# Patient Record
Sex: Female | Born: 2000 | Race: Black or African American | Hispanic: No | Marital: Single
Health system: Southern US, Community
[De-identification: ages and names within clinical notes are randomized; demographics above are authoritative.]

---
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
## 2021-01-07 DIAGNOSIS — M25311 Other instability, right shoulder: Secondary | ICD-10-CM | POA: Diagnosis not present

## 2021-01-07 DIAGNOSIS — M25511 Pain in right shoulder: Secondary | ICD-10-CM | POA: Diagnosis not present

## 2021-01-07 DIAGNOSIS — M6281 Muscle weakness (generalized): Secondary | ICD-10-CM | POA: Diagnosis not present

## 2021-01-12 DIAGNOSIS — M6281 Muscle weakness (generalized): Secondary | ICD-10-CM | POA: Diagnosis not present

## 2021-01-12 DIAGNOSIS — M25311 Other instability, right shoulder: Secondary | ICD-10-CM | POA: Diagnosis not present

## 2021-01-12 DIAGNOSIS — M25511 Pain in right shoulder: Secondary | ICD-10-CM | POA: Diagnosis not present

## 2021-01-14 DIAGNOSIS — M6281 Muscle weakness (generalized): Secondary | ICD-10-CM | POA: Diagnosis not present

## 2021-01-14 DIAGNOSIS — M25311 Other instability, right shoulder: Secondary | ICD-10-CM | POA: Diagnosis not present

## 2021-01-14 DIAGNOSIS — M25511 Pain in right shoulder: Secondary | ICD-10-CM | POA: Diagnosis not present

## 2021-01-20 DIAGNOSIS — M6281 Muscle weakness (generalized): Secondary | ICD-10-CM | POA: Diagnosis not present

## 2021-01-20 DIAGNOSIS — M25311 Other instability, right shoulder: Secondary | ICD-10-CM | POA: Diagnosis not present

## 2021-01-20 DIAGNOSIS — M25511 Pain in right shoulder: Secondary | ICD-10-CM | POA: Diagnosis not present

## 2021-01-24 DIAGNOSIS — M25511 Pain in right shoulder: Secondary | ICD-10-CM | POA: Diagnosis not present

## 2021-01-24 DIAGNOSIS — M25311 Other instability, right shoulder: Secondary | ICD-10-CM | POA: Diagnosis not present

## 2021-01-24 DIAGNOSIS — M6281 Muscle weakness (generalized): Secondary | ICD-10-CM | POA: Diagnosis not present

## 2021-02-01 DIAGNOSIS — M25311 Other instability, right shoulder: Secondary | ICD-10-CM | POA: Diagnosis not present

## 2021-02-01 DIAGNOSIS — M25511 Pain in right shoulder: Secondary | ICD-10-CM | POA: Diagnosis not present

## 2021-02-01 DIAGNOSIS — M6281 Muscle weakness (generalized): Secondary | ICD-10-CM | POA: Diagnosis not present

## 2021-02-23 DIAGNOSIS — M25511 Pain in right shoulder: Secondary | ICD-10-CM | POA: Diagnosis not present

## 2021-02-23 DIAGNOSIS — M6281 Muscle weakness (generalized): Secondary | ICD-10-CM | POA: Diagnosis not present

## 2021-02-23 DIAGNOSIS — M25311 Other instability, right shoulder: Secondary | ICD-10-CM | POA: Diagnosis not present

## 2021-03-01 DIAGNOSIS — M25311 Other instability, right shoulder: Secondary | ICD-10-CM | POA: Diagnosis not present

## 2021-03-01 DIAGNOSIS — M25511 Pain in right shoulder: Secondary | ICD-10-CM | POA: Diagnosis not present

## 2021-03-01 DIAGNOSIS — M6281 Muscle weakness (generalized): Secondary | ICD-10-CM | POA: Diagnosis not present

## 2021-03-04 DIAGNOSIS — M25512 Pain in left shoulder: Secondary | ICD-10-CM | POA: Diagnosis not present

## 2021-03-04 DIAGNOSIS — M25511 Pain in right shoulder: Secondary | ICD-10-CM | POA: Diagnosis not present

## 2021-04-04 DIAGNOSIS — Z1389 Encounter for screening for other disorder: Secondary | ICD-10-CM | POA: Diagnosis not present

## 2021-04-04 DIAGNOSIS — N946 Dysmenorrhea, unspecified: Secondary | ICD-10-CM | POA: Diagnosis not present

## 2021-04-04 DIAGNOSIS — Z113 Encounter for screening for infections with a predominantly sexual mode of transmission: Secondary | ICD-10-CM | POA: Diagnosis not present

## 2021-04-04 DIAGNOSIS — Z6820 Body mass index (BMI) 20.0-20.9, adult: Secondary | ICD-10-CM | POA: Diagnosis not present

## 2021-04-04 DIAGNOSIS — Z01419 Encounter for gynecological examination (general) (routine) without abnormal findings: Secondary | ICD-10-CM | POA: Diagnosis not present

## 2021-04-04 DIAGNOSIS — Z3040 Encounter for surveillance of contraceptives, unspecified: Secondary | ICD-10-CM | POA: Diagnosis not present

## 2021-06-14 DIAGNOSIS — Z3009 Encounter for other general counseling and advice on contraception: Secondary | ICD-10-CM | POA: Diagnosis not present

## 2021-06-27 DIAGNOSIS — Z3046 Encounter for surveillance of implantable subdermal contraceptive: Secondary | ICD-10-CM | POA: Diagnosis not present

## 2021-07-06 DIAGNOSIS — Z111 Encounter for screening for respiratory tuberculosis: Secondary | ICD-10-CM | POA: Diagnosis not present

## 2021-07-09 DIAGNOSIS — Z111 Encounter for screening for respiratory tuberculosis: Secondary | ICD-10-CM | POA: Diagnosis not present

## 2022-01-14 DIAGNOSIS — J020 Streptococcal pharyngitis: Secondary | ICD-10-CM | POA: Diagnosis not present

## 2022-01-14 DIAGNOSIS — R509 Fever, unspecified: Secondary | ICD-10-CM | POA: Diagnosis not present

## 2022-03-18 DIAGNOSIS — H5203 Hypermetropia, bilateral: Secondary | ICD-10-CM | POA: Diagnosis not present

## 2022-09-11 DIAGNOSIS — Z01419 Encounter for gynecological examination (general) (routine) without abnormal findings: Secondary | ICD-10-CM | POA: Diagnosis not present

## 2022-09-11 DIAGNOSIS — Z3040 Encounter for surveillance of contraceptives, unspecified: Secondary | ICD-10-CM | POA: Diagnosis not present

## 2022-09-11 DIAGNOSIS — B3731 Acute candidiasis of vulva and vagina: Secondary | ICD-10-CM | POA: Diagnosis not present

## 2022-09-11 DIAGNOSIS — N898 Other specified noninflammatory disorders of vagina: Secondary | ICD-10-CM | POA: Diagnosis not present

## 2022-09-11 DIAGNOSIS — Z113 Encounter for screening for infections with a predominantly sexual mode of transmission: Secondary | ICD-10-CM | POA: Diagnosis not present

## 2022-09-11 DIAGNOSIS — Z3202 Encounter for pregnancy test, result negative: Secondary | ICD-10-CM | POA: Diagnosis not present

## 2023-01-23 DIAGNOSIS — F4323 Adjustment disorder with mixed anxiety and depressed mood: Secondary | ICD-10-CM | POA: Diagnosis not present

## 2023-02-05 DIAGNOSIS — F4323 Adjustment disorder with mixed anxiety and depressed mood: Secondary | ICD-10-CM | POA: Diagnosis not present

## 2023-02-19 DIAGNOSIS — F4323 Adjustment disorder with mixed anxiety and depressed mood: Secondary | ICD-10-CM | POA: Diagnosis not present

## 2023-02-26 DIAGNOSIS — F4323 Adjustment disorder with mixed anxiety and depressed mood: Secondary | ICD-10-CM | POA: Diagnosis not present

## 2023-03-12 DIAGNOSIS — F4323 Adjustment disorder with mixed anxiety and depressed mood: Secondary | ICD-10-CM | POA: Diagnosis not present

## 2023-03-29 DIAGNOSIS — F4323 Adjustment disorder with mixed anxiety and depressed mood: Secondary | ICD-10-CM | POA: Diagnosis not present

## 2023-04-10 DIAGNOSIS — F4323 Adjustment disorder with mixed anxiety and depressed mood: Secondary | ICD-10-CM | POA: Diagnosis not present

## 2023-05-01 DIAGNOSIS — F4323 Adjustment disorder with mixed anxiety and depressed mood: Secondary | ICD-10-CM | POA: Diagnosis not present

## 2023-05-11 DIAGNOSIS — H5203 Hypermetropia, bilateral: Secondary | ICD-10-CM | POA: Diagnosis not present

## 2023-05-15 DIAGNOSIS — F4323 Adjustment disorder with mixed anxiety and depressed mood: Secondary | ICD-10-CM | POA: Diagnosis not present

## 2023-05-30 DIAGNOSIS — F4323 Adjustment disorder with mixed anxiety and depressed mood: Secondary | ICD-10-CM | POA: Diagnosis not present

## 2023-09-04 DIAGNOSIS — F332 Major depressive disorder, recurrent severe without psychotic features: Secondary | ICD-10-CM | POA: Diagnosis not present

## 2023-09-11 DIAGNOSIS — F332 Major depressive disorder, recurrent severe without psychotic features: Secondary | ICD-10-CM | POA: Diagnosis not present

## 2023-09-25 DIAGNOSIS — F332 Major depressive disorder, recurrent severe without psychotic features: Secondary | ICD-10-CM | POA: Diagnosis not present

## 2023-10-01 DIAGNOSIS — M25532 Pain in left wrist: Secondary | ICD-10-CM | POA: Diagnosis not present

## 2023-10-02 DIAGNOSIS — F332 Major depressive disorder, recurrent severe without psychotic features: Secondary | ICD-10-CM | POA: Diagnosis not present

## 2023-10-09 DIAGNOSIS — F332 Major depressive disorder, recurrent severe without psychotic features: Secondary | ICD-10-CM | POA: Diagnosis not present

## 2023-10-10 DIAGNOSIS — M25532 Pain in left wrist: Secondary | ICD-10-CM | POA: Diagnosis not present

## 2023-10-12 DIAGNOSIS — Z1331 Encounter for screening for depression: Secondary | ICD-10-CM | POA: Diagnosis not present

## 2023-10-12 DIAGNOSIS — Z113 Encounter for screening for infections with a predominantly sexual mode of transmission: Secondary | ICD-10-CM | POA: Diagnosis not present

## 2023-10-12 DIAGNOSIS — N926 Irregular menstruation, unspecified: Secondary | ICD-10-CM | POA: Diagnosis not present

## 2023-10-16 DIAGNOSIS — F332 Major depressive disorder, recurrent severe without psychotic features: Secondary | ICD-10-CM | POA: Diagnosis not present

## 2023-10-17 DIAGNOSIS — M25532 Pain in left wrist: Secondary | ICD-10-CM | POA: Diagnosis not present

## 2023-10-22 DIAGNOSIS — N939 Abnormal uterine and vaginal bleeding, unspecified: Secondary | ICD-10-CM | POA: Diagnosis not present

## 2023-10-22 DIAGNOSIS — Z975 Presence of (intrauterine) contraceptive device: Secondary | ICD-10-CM | POA: Diagnosis not present

## 2023-10-22 DIAGNOSIS — N921 Excessive and frequent menstruation with irregular cycle: Secondary | ICD-10-CM | POA: Diagnosis not present

## 2023-10-23 DIAGNOSIS — F332 Major depressive disorder, recurrent severe without psychotic features: Secondary | ICD-10-CM | POA: Diagnosis not present

## 2023-11-06 DIAGNOSIS — F332 Major depressive disorder, recurrent severe without psychotic features: Secondary | ICD-10-CM | POA: Diagnosis not present

## 2023-11-13 DIAGNOSIS — F332 Major depressive disorder, recurrent severe without psychotic features: Secondary | ICD-10-CM | POA: Diagnosis not present

## 2023-12-04 DIAGNOSIS — F332 Major depressive disorder, recurrent severe without psychotic features: Secondary | ICD-10-CM | POA: Diagnosis not present

## 2023-12-11 DIAGNOSIS — F332 Major depressive disorder, recurrent severe without psychotic features: Secondary | ICD-10-CM | POA: Diagnosis not present

## 2024-01-15 DIAGNOSIS — F332 Major depressive disorder, recurrent severe without psychotic features: Secondary | ICD-10-CM | POA: Diagnosis not present

## 2024-01-22 DIAGNOSIS — M25532 Pain in left wrist: Secondary | ICD-10-CM | POA: Diagnosis not present

## 2024-02-12 DIAGNOSIS — F332 Major depressive disorder, recurrent severe without psychotic features: Secondary | ICD-10-CM | POA: Diagnosis not present

## 2024-03-11 DIAGNOSIS — F332 Major depressive disorder, recurrent severe without psychotic features: Secondary | ICD-10-CM | POA: Diagnosis not present

## 2024-04-02 DIAGNOSIS — M25532 Pain in left wrist: Secondary | ICD-10-CM | POA: Diagnosis not present

## 2024-04-15 DIAGNOSIS — F332 Major depressive disorder, recurrent severe without psychotic features: Secondary | ICD-10-CM | POA: Diagnosis not present

## 2024-06-10 DIAGNOSIS — F332 Major depressive disorder, recurrent severe without psychotic features: Secondary | ICD-10-CM | POA: Diagnosis not present

## 2024-06-17 DIAGNOSIS — H5203 Hypermetropia, bilateral: Secondary | ICD-10-CM | POA: Diagnosis not present

## 2024-06-18 DIAGNOSIS — Z3046 Encounter for surveillance of implantable subdermal contraceptive: Secondary | ICD-10-CM | POA: Diagnosis not present

## 2024-06-18 DIAGNOSIS — N946 Dysmenorrhea, unspecified: Secondary | ICD-10-CM | POA: Diagnosis not present

## 2024-06-18 DIAGNOSIS — Z113 Encounter for screening for infections with a predominantly sexual mode of transmission: Secondary | ICD-10-CM | POA: Diagnosis not present

## 2024-06-18 DIAGNOSIS — Z130 Encounter for screening for diseases of the blood and blood-forming organs and certain disorders involving the immune mechanism: Secondary | ICD-10-CM | POA: Diagnosis not present

## 2024-06-18 DIAGNOSIS — Z01419 Encounter for gynecological examination (general) (routine) without abnormal findings: Secondary | ICD-10-CM | POA: Diagnosis not present

## 2024-06-18 DIAGNOSIS — Z3009 Encounter for other general counseling and advice on contraception: Secondary | ICD-10-CM | POA: Diagnosis not present

## 2024-06-18 DIAGNOSIS — Z1151 Encounter for screening for human papillomavirus (HPV): Secondary | ICD-10-CM | POA: Diagnosis not present

## 2024-06-18 DIAGNOSIS — Z124 Encounter for screening for malignant neoplasm of cervix: Secondary | ICD-10-CM | POA: Diagnosis not present

## 2024-06-18 DIAGNOSIS — Z1389 Encounter for screening for other disorder: Secondary | ICD-10-CM | POA: Diagnosis not present

## 2024-07-08 DIAGNOSIS — F332 Major depressive disorder, recurrent severe without psychotic features: Secondary | ICD-10-CM | POA: Diagnosis not present

## 2024-08-19 DIAGNOSIS — F332 Major depressive disorder, recurrent severe without psychotic features: Secondary | ICD-10-CM | POA: Diagnosis not present

## 2024-08-29 DIAGNOSIS — Z3009 Encounter for other general counseling and advice on contraception: Secondary | ICD-10-CM | POA: Diagnosis not present

## 2024-08-29 DIAGNOSIS — Z113 Encounter for screening for infections with a predominantly sexual mode of transmission: Secondary | ICD-10-CM | POA: Diagnosis not present

## 2024-09-13 DIAGNOSIS — G44209 Tension-type headache, unspecified, not intractable: Secondary | ICD-10-CM | POA: Diagnosis not present

## 2024-09-23 DIAGNOSIS — F332 Major depressive disorder, recurrent severe without psychotic features: Secondary | ICD-10-CM | POA: Diagnosis not present

## 2024-10-14 DIAGNOSIS — F332 Major depressive disorder, recurrent severe without psychotic features: Secondary | ICD-10-CM | POA: Diagnosis not present

## 2024-11-11 DIAGNOSIS — F332 Major depressive disorder, recurrent severe without psychotic features: Secondary | ICD-10-CM | POA: Diagnosis not present
